# Patient Record
Sex: Female | Born: 1975 | Race: White | Hispanic: No | Marital: Single | Smoking: Current every day smoker
Health system: Southern US, Community
[De-identification: ages and names within clinical notes are randomized; demographics above are authoritative.]

## PROBLEM LIST (undated history)

## (undated) DIAGNOSIS — E119 Type 2 diabetes mellitus without complications: Secondary | ICD-10-CM

## (undated) DIAGNOSIS — F329 Major depressive disorder, single episode, unspecified: Secondary | ICD-10-CM

## (undated) DIAGNOSIS — E785 Hyperlipidemia, unspecified: Secondary | ICD-10-CM

## (undated) DIAGNOSIS — F32A Depression, unspecified: Secondary | ICD-10-CM

## (undated) DIAGNOSIS — K219 Gastro-esophageal reflux disease without esophagitis: Secondary | ICD-10-CM

## (undated) DIAGNOSIS — J45909 Unspecified asthma, uncomplicated: Secondary | ICD-10-CM

---

## 2012-05-09 ENCOUNTER — Emergency Department: Payer: Self-pay | Admitting: Internal Medicine

## 2012-05-09 LAB — URINALYSIS, COMPLETE
Glucose,UR: NEGATIVE mg/dL (ref 0–75)
Leukocyte Esterase: NEGATIVE
Nitrite: NEGATIVE
Squamous Epithelial: 3

## 2012-05-09 LAB — CBC
HCT: 37.6 % (ref 35.0–47.0)
MCH: 29.8 pg (ref 26.0–34.0)
MCHC: 34.2 g/dL (ref 32.0–36.0)
MCV: 87 fL (ref 80–100)
RDW: 13.2 % (ref 11.5–14.5)
WBC: 21.5 10*3/uL — ABNORMAL HIGH (ref 3.6–11.0)

## 2012-05-09 LAB — COMPREHENSIVE METABOLIC PANEL
Anion Gap: 6 — ABNORMAL LOW (ref 7–16)
BUN: 16 mg/dL (ref 7–18)
Bilirubin,Total: 0.8 mg/dL (ref 0.2–1.0)
Calcium, Total: 8.3 mg/dL — ABNORMAL LOW (ref 8.5–10.1)
Chloride: 98 mmol/L (ref 98–107)
Co2: 29 mmol/L (ref 21–32)
Creatinine: 0.85 mg/dL (ref 0.60–1.30)
EGFR (Non-African Amer.): >60
Glucose: 96 mg/dL (ref 65–99)
Osmolality: 267 (ref 275–301)
SGOT(AST): 15 U/L (ref 15–37)
Total Protein: 8.3 g/dL — ABNORMAL HIGH (ref 6.4–8.2)

## 2012-05-09 LAB — HCG, QUANTITATIVE, PREGNANCY: Beta Hcg, Quant.: <1 m[IU]/mL — ABNORMAL LOW

## 2012-05-15 LAB — CULTURE, BLOOD (SINGLE)

## 2013-02-01 ENCOUNTER — Emergency Department: Payer: Self-pay | Admitting: Emergency Medicine

## 2013-03-15 ENCOUNTER — Emergency Department: Payer: Self-pay | Admitting: Emergency Medicine

## 2013-06-05 ENCOUNTER — Emergency Department: Payer: Self-pay | Admitting: Emergency Medicine

## 2013-06-05 LAB — URINALYSIS, COMPLETE
Bacteria: NONE SEEN
Bilirubin,UR: NEGATIVE
Glucose,UR: NEGATIVE mg/dL (ref 0–75)
Hyaline Cast: 2
KETONE: NEGATIVE
Leukocyte Esterase: NEGATIVE
NITRITE: NEGATIVE
Ph: 5 (ref 4.5–8.0)
Protein: NEGATIVE
Specific Gravity: 1.019 (ref 1.003–1.030)
Squamous Epithelial: <1
WBC UR: <1 /HPF (ref 0–5)

## 2013-06-05 LAB — CBC WITH DIFFERENTIAL/PLATELET
Basophil #: 0.1 10*3/uL (ref 0.0–0.1)
Basophil %: 0.7 %
EOS ABS: 0.2 10*3/uL (ref 0.0–0.7)
Eosinophil %: 1.7 %
HCT: 42.2 % (ref 35.0–47.0)
HGB: 14.3 g/dL (ref 12.0–16.0)
Lymphocyte #: 2.3 10*3/uL (ref 1.0–3.6)
Lymphocyte %: 21.4 %
MCH: 29.7 pg (ref 26.0–34.0)
MCHC: 33.9 g/dL (ref 32.0–36.0)
MCV: 88 fL (ref 80–100)
MONO ABS: 0.6 x10 3/mm (ref 0.2–0.9)
MONOS PCT: 5.5 %
NEUTROS PCT: 70.7 %
Neutrophil #: 7.5 10*3/uL — ABNORMAL HIGH (ref 1.4–6.5)
PLATELETS: 335 10*3/uL (ref 150–440)
RBC: 4.82 10*6/uL (ref 3.80–5.20)
RDW: 12.6 % (ref 11.5–14.5)
WBC: 10.7 10*3/uL (ref 3.6–11.0)

## 2013-06-05 LAB — COMPREHENSIVE METABOLIC PANEL
ALBUMIN: 3.6 g/dL (ref 3.4–5.0)
ALK PHOS: 88 U/L
ANION GAP: 7 (ref 7–16)
BUN: 12 mg/dL (ref 7–18)
Bilirubin,Total: 0.3 mg/dL (ref 0.2–1.0)
CALCIUM: 8.9 mg/dL (ref 8.5–10.1)
CHLORIDE: 110 mmol/L — AB (ref 98–107)
CREATININE: 0.8 mg/dL (ref 0.60–1.30)
Co2: 23 mmol/L (ref 21–32)
EGFR (African American): >60
Glucose: 95 mg/dL (ref 65–99)
Osmolality: 279 (ref 275–301)
Potassium: 3.9 mmol/L (ref 3.5–5.1)
SGOT(AST): 22 U/L (ref 15–37)
SGPT (ALT): 19 U/L (ref 12–78)
SODIUM: 140 mmol/L (ref 136–145)
TOTAL PROTEIN: 7.8 g/dL (ref 6.4–8.2)

## 2013-06-05 LAB — LIPASE, BLOOD: Lipase: 153 U/L (ref 73–393)

## 2013-06-05 LAB — PREGNANCY, URINE: Pregnancy Test, Urine: NEGATIVE m[IU]/mL

## 2013-06-05 LAB — TROPONIN I

## 2013-10-11 ENCOUNTER — Emergency Department: Payer: Self-pay | Admitting: Student

## 2014-03-17 ENCOUNTER — Emergency Department: Payer: Self-pay | Admitting: Student

## 2014-12-20 ENCOUNTER — Emergency Department
Admission: EM | Admit: 2014-12-20 | Discharge: 2014-12-20 | Disposition: A | Discharge status: Home / Self Care | Payer: Self-pay | Attending: Student | Admitting: Student

## 2014-12-20 ENCOUNTER — Encounter: Payer: Self-pay | Admitting: Emergency Medicine

## 2014-12-20 DIAGNOSIS — H6501 Acute serous otitis media, right ear: Secondary | ICD-10-CM | POA: Insufficient documentation

## 2014-12-20 DIAGNOSIS — E119 Type 2 diabetes mellitus without complications: Secondary | ICD-10-CM | POA: Insufficient documentation

## 2014-12-20 DIAGNOSIS — F329 Major depressive disorder, single episode, unspecified: Secondary | ICD-10-CM | POA: Insufficient documentation

## 2014-12-20 DIAGNOSIS — F17200 Nicotine dependence, unspecified, uncomplicated: Secondary | ICD-10-CM | POA: Insufficient documentation

## 2014-12-20 DIAGNOSIS — E785 Hyperlipidemia, unspecified: Secondary | ICD-10-CM | POA: Insufficient documentation

## 2014-12-20 DIAGNOSIS — R590 Localized enlarged lymph nodes: Secondary | ICD-10-CM | POA: Insufficient documentation

## 2014-12-20 HISTORY — DX: Depression, unspecified: F32.A

## 2014-12-20 HISTORY — DX: Unspecified asthma, uncomplicated: J45.909

## 2014-12-20 HISTORY — DX: Hyperlipidemia, unspecified: E78.5

## 2014-12-20 HISTORY — DX: Major depressive disorder, single episode, unspecified: F32.9

## 2014-12-20 HISTORY — DX: Type 2 diabetes mellitus without complications: E11.9

## 2014-12-20 HISTORY — DX: Gastro-esophageal reflux disease without esophagitis: K21.9

## 2014-12-20 MED ORDER — TRAMADOL HCL 50 MG PO TABS
50.0000 mg | ORAL_TABLET | Freq: Once | ORAL | Status: AC
  Administered 2014-12-20: 50 mg via ORAL
  Filled 2014-12-20: qty 1

## 2014-12-20 MED ORDER — ONDANSETRON 4 MG PO TBDP
4.0000 mg | ORAL_TABLET | Freq: Once | ORAL | Status: AC
  Administered 2014-12-20: 4 mg via ORAL

## 2014-12-20 MED ORDER — TRAMADOL HCL 50 MG PO TABS: 50.0000 mg | ORAL_TABLET | Freq: Four times a day (QID) | ORAL | Status: DC | PRN

## 2014-12-20 MED ORDER — IBUPROFEN 800 MG PO TABS
800.0000 mg | ORAL_TABLET | Freq: Once | ORAL | Status: AC
  Administered 2014-12-20: 800 mg via ORAL
  Filled 2014-12-20: qty 1

## 2014-12-20 MED ORDER — AMOXICILLIN 500 MG PO CAPS: 500.0000 mg | ORAL_CAPSULE | Freq: Three times a day (TID) | ORAL | Status: DC

## 2014-12-20 MED ORDER — ONDANSETRON 4 MG PO TBDP
ORAL_TABLET | ORAL | Status: AC
  Filled 2014-12-20: qty 1

## 2014-12-20 MED ORDER — IBUPROFEN 800 MG PO TABS: 800.0000 mg | ORAL_TABLET | Freq: Three times a day (TID) | ORAL | Status: DC | PRN

## 2014-12-20 NOTE — ED Notes (Signed)
Pt to ed with c/o left earache x 1 week.  Pt states pain radiates into left side of neck.

## 2014-12-20 NOTE — Discharge Instructions (Signed)

## 2014-12-20 NOTE — ED Notes (Signed)
NAD noted at time of D/C. Pt denies questions or concerns. Pt ambulatory to the lobby at this time. Pt refused wheelchair to the lobby.  

## 2014-12-20 NOTE — ED Provider Notes (Signed)
[facility] Emergency Department Provider Note  ____________________________________________  Time seen: Approximately 7:14 AM  I have reviewed the triage vital signs and the nursing notes.   HISTORY  Chief Complaint Otalgia    HPI Paige Sullivan is a 39 y.o. female patient complaining of left ear pain for 1 week. Patient states she also has a swollen knot left side of her neck. Patient denies any hearing loss. Patient denies any vertigo. Patient stated there is no URI signs symptoms. Patient is rating the pain is over 10. No palliative measures taken for this complaint.   Past Medical History  Diagnosis Date  . GERD (gastroesophageal reflux disease)   . Diabetes mellitus without complication (HCC)   . Hyperlipidemia   . Depression   . Asthma     There are no active problems to display for this patient.   History reviewed. No pertinent past surgical history.  Current Outpatient Rx  Name  Route  Sig  Dispense  Refill  . amoxicillin (AMOXIL) 500 MG capsule   Oral   Take 1 capsule (500 mg total) by mouth 3 (three) times daily.   30 capsule   0   . ibuprofen (ADVIL,MOTRIN) 800 MG tablet   Oral   Take 1 tablet (800 mg total) by mouth every 8 (eight) hours as needed for moderate pain.   15 tablet   0   . traMADol (ULTRAM) 50 MG tablet   Oral   Take 1 tablet (50 mg total) by mouth every 6 (six) hours as needed for moderate pain.   12 tablet   0     Allergies Review of patient's allergies indicates no known allergies.  History reviewed. No pertinent family history.  Social History Social History  Substance Use Topics  . Smoking status: Current Every Day Smoker  . Smokeless tobacco: None  . Alcohol Use: No    Review of Systems Constitutional: No fever/chills Eyes: No visual changes. ENT: No sore throat. Left ear pain Cardiovascular: Denies chest pain. Respiratory: Denies shortness of breath. Gastrointestinal: No abdominal  pain.  No nausea, no vomiting.  No diarrhea.  No constipation. Genitourinary: Negative for dysuria. Musculoskeletal: Negative for back pain. Skin: Negative for rash. Neurological: Negative for headaches, focal weakness or numbness. Psychiatric:Depression Endocrine:Diabetes and hyperlipidemia. Hematological/Lymphatic: 10-point ROS otherwise negative.  ____________________________________________   PHYSICAL EXAM:  VITAL SIGNS: ED Triage Vitals  Enc Vitals Group     BP 12/20/14 0705 112/61 mmHg     Pulse Rate 12/20/14 0705 84     Resp 12/20/14 0705 18     Temp 12/20/14 0705 97.7 F (36.5 C)     Temp Source 12/20/14 0705 Oral     SpO2 12/20/14 0705 97 %     Weight 12/20/14 0705 205 lb (92.987 kg)     Height 12/20/14 0705  (1.626 m)     Head Cir --      Peak Flow --      Pain Score 12/20/14 0706 5     Pain Loc --      Pain Edu? --      Excl. in GC? --     Constitutional: Alert and oriented. Well appearing and in no acute distress. Eyes: Conjunctivae are normal. PERRL. EOMI. Head: Atraumatic. Nose: No congestion/rhinnorhea. Mouth/Throat: Mucous membranes are moist.  Oropharynx non-erythematous. EARS: Edematous and erythematous left TM. Neck: No stridor.  Nocervical spine tenderness to palpation. Hematological/Lymphatic/[facility]. Cardiovascular: Normal rate, regular rhythm. Grossly  normal heart sounds.  Good peripheral circulation. Respiratory: Normal respiratory effort.  No retractions. Lungs CTAB. Gastrointestinal: Soft and nontender. No distention. No abdominal bruits. No CVA tenderness. Musculoskeletal: No lower extremity tenderness nor edema.  No joint effusions. Neurologic:  Normal speech and language. No gross focal neurologic deficits are appreciated. No gait instability. Skin:  Skin is warm, dry and intact. No rash noted. Psychiatric: Mood and affect are normal. Speech and behavior are  normal.  ____________________________________________   LABS (all labs ordered are listed, but only abnormal results are displayed)  Labs Reviewed - No data to display ____________________________________________  EKG   ____________________________________________  RADIOLOGY   ____________________________________________   PROCEDURES  Procedure(s) performed: None  Critical Care performed: No  ____________________________________________   INITIAL IMPRESSION / ASSESSMENT AND PLAN / ED COURSE  Pertinent labs & imaging results that were available during my care of the patient were reviewed by me and considered in my medical decision making (see chart for details).  Left otitis media. Patient given discharge Instructions. Patient advised to follow-up in ENT clinic if her condition does not improve within the next 3 days. Patient given a prescription for amoxicillin, tramadol, and ibuprofen. ____________________________________________   FINAL CLINICAL IMPRESSION(S) / ED DIAGNOSES  Final diagnoses:  Right acute serous otitis media, recurrence not specified      Joni Reiningonald K Serenity Batley, PA-C 12/20/14 0725  Gayla DossEryka A Gayle, MD 12/20/14 1540

## 2015-01-27 ENCOUNTER — Encounter: Payer: Self-pay | Admitting: Emergency Medicine

## 2015-01-27 ENCOUNTER — Emergency Department
Admission: EM | Admit: 2015-01-27 | Discharge: 2015-01-27 | Disposition: A | Discharge status: Home / Self Care | Payer: Self-pay | Attending: Emergency Medicine | Admitting: Emergency Medicine

## 2015-01-27 ENCOUNTER — Emergency Department: Payer: Self-pay

## 2015-01-27 DIAGNOSIS — Z3202 Encounter for pregnancy test, result negative: Secondary | ICD-10-CM | POA: Insufficient documentation

## 2015-01-27 DIAGNOSIS — F17200 Nicotine dependence, unspecified, uncomplicated: Secondary | ICD-10-CM | POA: Insufficient documentation

## 2015-01-27 DIAGNOSIS — E119 Type 2 diabetes mellitus without complications: Secondary | ICD-10-CM | POA: Insufficient documentation

## 2015-01-27 DIAGNOSIS — K529 Noninfective gastroenteritis and colitis, unspecified: Secondary | ICD-10-CM | POA: Insufficient documentation

## 2015-01-27 LAB — CBC WITH DIFFERENTIAL/PLATELET
BASOS PCT: 1 %
Basophils Absolute: 0.1 10*3/uL (ref 0–0.1)
EOS ABS: 0.1 10*3/uL (ref 0–0.7)
Eosinophils Relative: 1 %
HCT: 44.7 % (ref 35.0–47.0)
HEMOGLOBIN: 14.9 g/dL (ref 12.0–16.0)
LYMPHS ABS: 0.7 10*3/uL — AB (ref 1.0–3.6)
Lymphocytes Relative: 5 %
MCH: 28.4 pg (ref 26.0–34.0)
MCHC: 33.3 g/dL (ref 32.0–36.0)
MCV: 85.1 fL (ref 80.0–100.0)
MONO ABS: 0.3 10*3/uL (ref 0.2–0.9)
MONOS PCT: 2 %
NEUTROS PCT: 91 %
Neutro Abs: 13.1 10*3/uL — ABNORMAL HIGH (ref 1.4–6.5)
Platelets: 315 10*3/uL (ref 150–440)
RBC: 5.25 MIL/uL — ABNORMAL HIGH (ref 3.80–5.20)
RDW: 13.8 % (ref 11.5–14.5)
WBC: 14.2 10*3/uL — ABNORMAL HIGH (ref 3.6–11.0)

## 2015-01-27 LAB — URINALYSIS COMPLETE WITH MICROSCOPIC (ARMC ONLY)
BACTERIA UA: NONE SEEN
Bilirubin Urine: NEGATIVE
Glucose, UA: NEGATIVE mg/dL
Ketones, ur: NEGATIVE mg/dL
LEUKOCYTES UA: NEGATIVE
NITRITE: NEGATIVE
PH: 6 (ref 5.0–8.0)
Protein, ur: 30 mg/dL — AB
SPECIFIC GRAVITY, URINE: 1.019 (ref 1.005–1.030)

## 2015-01-27 LAB — COMPREHENSIVE METABOLIC PANEL
ALT: 20 U/L (ref 14–54)
ANION GAP: 9 (ref 5–15)
AST: 15 U/L (ref 15–41)
Albumin: 4.1 g/dL (ref 3.5–5.0)
Alkaline Phosphatase: 89 U/L (ref 38–126)
BILIRUBIN TOTAL: 0.6 mg/dL (ref 0.3–1.2)
BUN: 16 mg/dL (ref 6–20)
CALCIUM: 8.9 mg/dL (ref 8.9–10.3)
CO2: 24 mmol/L (ref 22–32)
Chloride: 103 mmol/L (ref 101–111)
Creatinine, Ser: 0.5 mg/dL (ref 0.44–1.00)
GFR calc non Af Amer: >60 mL/min (ref >60)
Glucose, Bld: 113 mg/dL — ABNORMAL HIGH (ref 65–99)
POTASSIUM: 3.8 mmol/L (ref 3.5–5.1)
SODIUM: 136 mmol/L (ref 135–145)
TOTAL PROTEIN: 7.6 g/dL (ref 6.5–8.1)

## 2015-01-27 LAB — POCT PREGNANCY, URINE: PREG TEST UR: NEGATIVE

## 2015-01-27 LAB — LIPASE, BLOOD: Lipase: 25 U/L (ref 11–51)

## 2015-01-27 MED ORDER — MORPHINE SULFATE (PF) 4 MG/ML IV SOLN
4.0000 mg | Freq: Once | INTRAVENOUS | Status: AC
  Administered 2015-01-27: 4 mg via INTRAVENOUS
  Filled 2015-01-27: qty 1

## 2015-01-27 MED ORDER — SODIUM CHLORIDE 0.9 % IV SOLN
1000.0000 mL | Freq: Once | INTRAVENOUS | Status: AC
  Administered 2015-01-27: 1000 mL via INTRAVENOUS

## 2015-01-27 MED ORDER — DICYCLOMINE HCL 20 MG PO TABS: 20.0000 mg | ORAL_TABLET | Freq: Three times a day (TID) | ORAL | Status: AC | PRN

## 2015-01-27 MED ORDER — ONDANSETRON HCL 4 MG PO TABS: 4.0000 mg | ORAL_TABLET | Freq: Every day | ORAL | Status: AC | PRN

## 2015-01-27 MED ORDER — ONDANSETRON HCL 4 MG/2ML IJ SOLN: 4.0000 mg | Freq: Once | INTRAMUSCULAR | Status: DC

## 2015-01-27 MED ORDER — ONDANSETRON HCL 4 MG/2ML IJ SOLN
4.0000 mg | Freq: Once | INTRAMUSCULAR | Status: AC
  Administered 2015-01-27: 4 mg via INTRAVENOUS
  Filled 2015-01-27: qty 2

## 2015-01-27 NOTE — ED Provider Notes (Signed)
[facility] Emergency Department Provider Note     Time seen: ----------------------------------------- 8:06 AM on 01/27/2015 -----------------------------------------    I have reviewed the triage vital signs and the nursing notes.   HISTORY  Chief Complaint Emesis    HPI Paige Sullivan is a 40 y.o. female who presents ER for nausea vomiting and diarrhea. Patient also has some mid abdominal pain that started last night. Patient states she vomited about 7 times and had diarrhea more times that. Patient denies any fevers but has had chills, denies chest pain or shortness of breath. Nothing makes her symptoms better or worse.   Past Medical History  Diagnosis Date  . GERD (gastroesophageal reflux disease)   . Diabetes mellitus without complication (HCC)   . Hyperlipidemia   . Depression   . Asthma     There are no active problems to display for this patient.   History reviewed. No pertinent past surgical history.  Allergies Tramadol  Social History Social History  Substance Use Topics  . Smoking status: Current Every Day Smoker  . Smokeless tobacco: None  . Alcohol Use: No    Review of Systems Constitutional: Negative for fever. Eyes: Negative for visual changes. ENT: Negative for sore throat. Cardiovascular: Negative for chest pain. Respiratory: Negative for shortness of breath. Gastrointestinal: Positive for abdominal pain, vomiting and diarrhea Genitourinary: Negative for dysuria. Musculoskeletal: Negative for back pain. Skin: Negative for rash. Neurological: Negative for headaches, focal weakness or numbness.  10-point ROS otherwise negative.  ____________________________________________   PHYSICAL EXAM:  VITAL SIGNS: ED Triage Vitals  Enc Vitals Group     BP 01/27/15 0756 125/68 mmHg     Pulse Rate 01/27/15 0756 112     Resp 01/27/15 0756 18     Temp 01/27/15 0756 98.5 F (36.9 C)     Temp Source 01/27/15  0756 Oral     SpO2 01/27/15 0756 97 %     Weight 01/27/15 0756 191 lb (86.637 kg)     Height 01/27/15 0756  (1.626 m)     Head Cir --      Peak Flow --      Pain Score 01/27/15 0757 5     Pain Loc --      Pain Edu? --      Excl. in GC? --     Constitutional: Alert and oriented. Well appearing and in no distress. Eyes: Conjunctivae are normal. PERRL. Normal extraocular movements. ENT   Head: Normocephalic and atraumatic.   Nose: No congestion/rhinnorhea.   Mouth/Throat: Mucous membranes are moist.   Neck: No stridor. Cardiovascular: Normal rate, regular rhythm. Normal and symmetric distal pulses are present in all extremities. No murmurs, rubs, or gallops. Respiratory: Normal respiratory effort without tachypnea nor retractions. Breath sounds are clear and equal bilaterally. No wheezes/rales/rhonchi. Gastrointestinal: Nonfocal abdominal tenderness, no rebound or guarding. Normal bowel sounds. Musculoskeletal: Nontender with normal range of motion in all extremities. No joint effusions.  No lower extremity tenderness nor edema. Neurologic:  Normal speech and language. No gross focal neurologic deficits are appreciated. Speech is normal. No gait instability. Skin:  Skin is warm, dry and intact. No rash noted. Psychiatric: Mood and affect are normal. Speech and behavior are normal. Patient exhibits appropriate insight and judgment. ____________________________________________  ED COURSE:  Pertinent labs & imaging results that were available during my care of the patient were reviewed by me and considered in my medical decision making (see chart for details). Patient is in  no acute distress, likely Norovirus infection. Will receive IV fluids and antiemetics. ____________________________________________    LABS (pertinent positives/negatives)  Labs Reviewed  CBC WITH DIFFERENTIAL/PLATELET - Abnormal; Notable for the following:    WBC 14.2 (*)    RBC 5.25 (*)     Neutro Abs 13.1 (*)    Lymphs Abs 0.7 (*)    All other components within normal limits  COMPREHENSIVE METABOLIC PANEL - Abnormal; Notable for the following:    Glucose, Bld 113 (*)    All other components within normal limits  URINALYSIS COMPLETEWITH MICROSCOPIC ([facility] ONLY) - Abnormal; Notable for the following:    Color, Urine YELLOW (*)    APPearance CLEAR (*)    Hgb urine dipstick 1+ (*)    Protein, ur 30 (*)    Squamous Epithelial / LPF 0-5 (*)    All other components within normal limits  LIPASE, BLOOD  POCT PREGNANCY, URINE  POC URINE PREG, ED    RADIOLOGY  Abdomen 2 view IMPRESSION: The bowel gas pattern suggests a gastroenteritis type process. There is no evidence of obstruction or perforation. There are multiple calcified gallstones. ____________________________________________  FINAL ASSESSMENT AND PLAN  Gastroenteritis  Plan: Patient with labs and imaging as dictated above. Patient is in no acute distress, her symptoms are gone now. I will discharge with Zofran and Bentyl. She is stable for outpatient follow-up with her doctor.   Emily Filbert, MD   Emily Filbert, MD 01/27/15 [id number]

## 2015-01-27 NOTE — ED Notes (Signed)
Reports n/v/d, chills and upper abd pain since last pm

## 2015-01-27 NOTE — Discharge Instructions (Signed)
Norovirus Infection °A norovirus infection is caused by exposure to a virus in a group of similar viruses (noroviruses). This type of infection causes inflammation in your stomach and intestines (gastroenteritis). Norovirus is the most common cause of gastroenteritis. It also causes food poisoning. °Anyone can get a norovirus infection. It spreads very easily (contagious). You can get it from contaminated food, water, surfaces, or other people. Norovirus is found in the stool or vomit of infected people. You can spread the infection as soon as you feel sick until 2 weeks after you recover.  °Symptoms usually begin within 2 days after you become infected. Most norovirus symptoms affect the digestive system. °CAUSES °Norovirus infection is caused by contact with norovirus. You can catch norovirus if you: °· Eat or drink something contaminated with norovirus. °· Touch surfaces or objects contaminated with norovirus and then put your hand in your mouth. °· Have direct contact with an infected person who has symptoms. °· Share food, drink, or utensils with someone with who is sick with norovirus. °SIGNS AND SYMPTOMS °Symptoms of norovirus may include: °· Nausea. °· Vomiting. °· Diarrhea. °· Stomach cramps. °· Fever. °· Chills. °· Headache. °· Muscle aches. °· Tiredness. °DIAGNOSIS °Your health care provider may suspect norovirus based on your symptoms and physical exam. Your health care provider may also test a sample of your stool or vomit for the virus.  °TREATMENT °There is no specific treatment for norovirus. Most people get better without treatment in about 2 days. °HOME CARE INSTRUCTIONS °· Replace lost fluids by drinking plenty of water or rehydration fluids containing important minerals called electrolytes. This prevents dehydration. Drink enough fluid to keep your urine clear or pale yellow. °· Do not prepare food for others while you are infected. Wait at least 3 days after recovering from the illness to do  that. °PREVENTION  °· Wash your hands often, especially after using the toilet or changing a diaper. °· Wash fruits and vegetables thoroughly before preparing or serving them. °· Throw out any food that a sick person may have touched. °· Disinfect contaminated surfaces immediately after someone in the household has been sick. Use a bleach-based household cleaner. °· Immediately remove and wash soiled clothes or sheets. °SEEK MEDICAL CARE IF: °· Your vomiting, diarrhea, and stomach pain is getting worse. °· Your symptoms of norovirus do not go away after 2-3 days. °SEEK IMMEDIATE MEDICAL CARE IF:  °You develop symptoms of dehydration that do not improve with fluid replacement. This may include: °· Excessive sleepiness. °· Lack of tears. °· Dry mouth. °· Dizziness when standing. °· Weak pulse. °  °This information is not intended to replace advice given to you by your health care provider. Make sure you discuss any questions you have with your health care provider. °  °Document Released: 03/12/2002 Document Revised: 01/10/2014 Document Reviewed: 05/30/2013 °Elsevier Interactive Patient Education ©2016 [employer] ° °

## 2017-06-26 IMAGING — CR DG ABDOMEN 2V
1 series · 3 of 3 positions shown · non-contrast
Comparison: None in PACs

CLINICAL DATA: Vomiting abdominal pain and diarrhea with onset last
night with worsening symptoms ; history of diabetes,
gastroesophageal reflux, and gallstones.

EXAM:
ABDOMEN - 2 VIEW

[Series 1: w abdomen upright · 0.14mm/px · 3 of 3 slices shown]
[im 1/3]
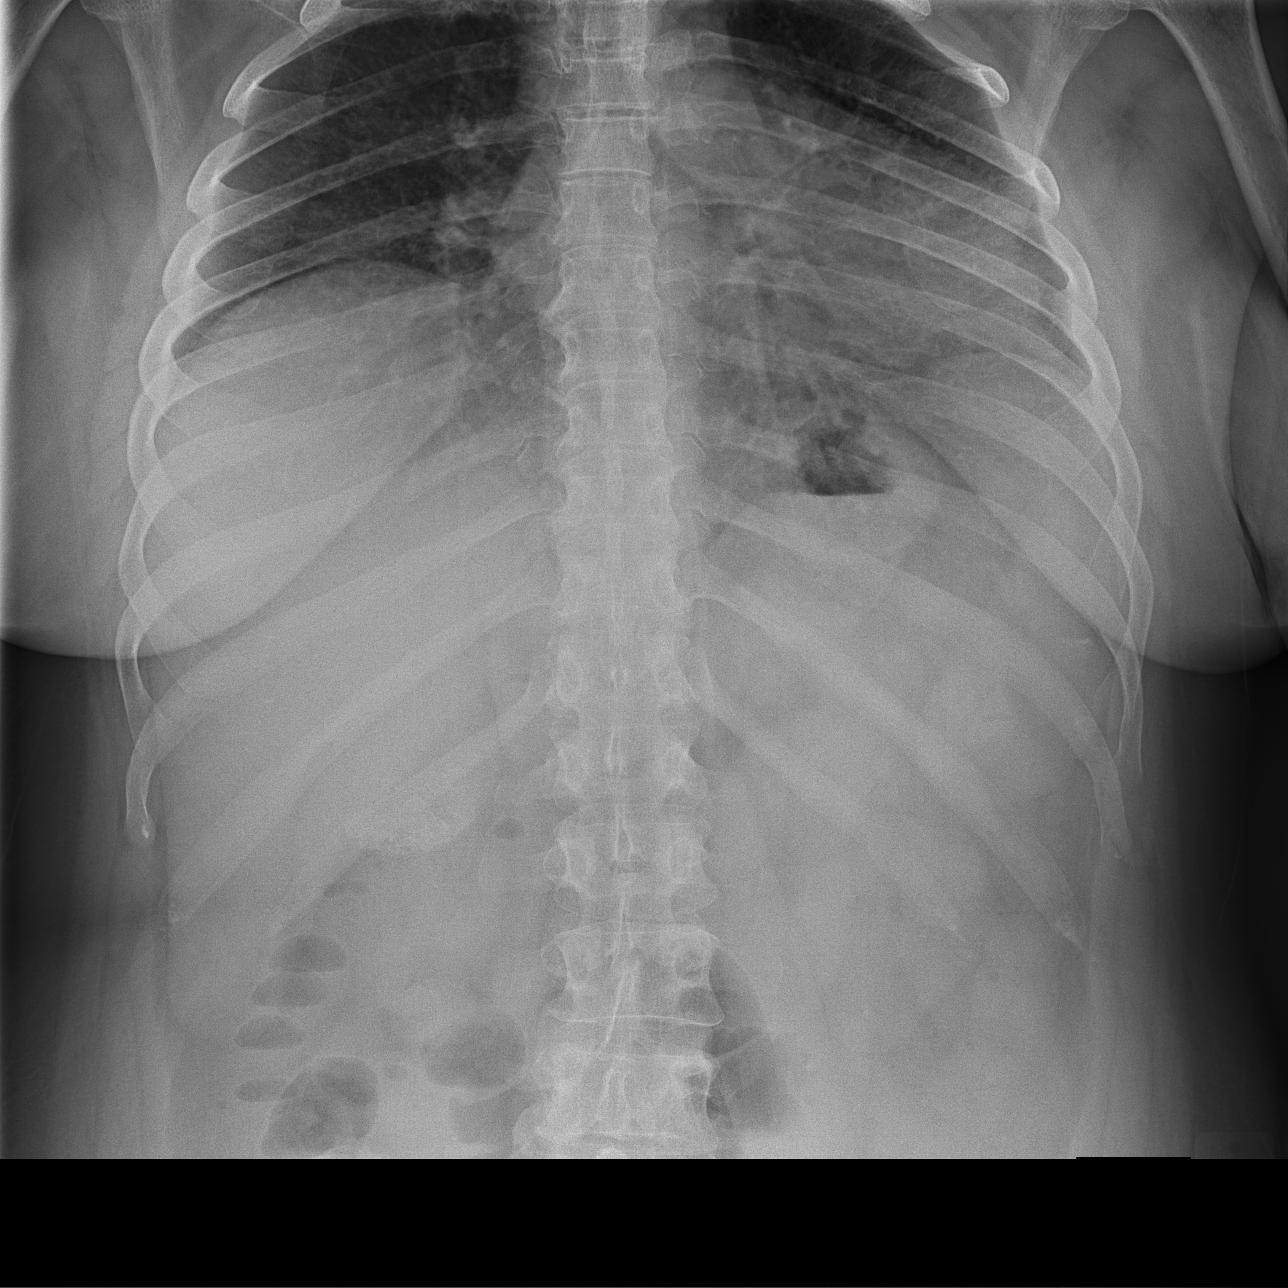
[im 2/3]
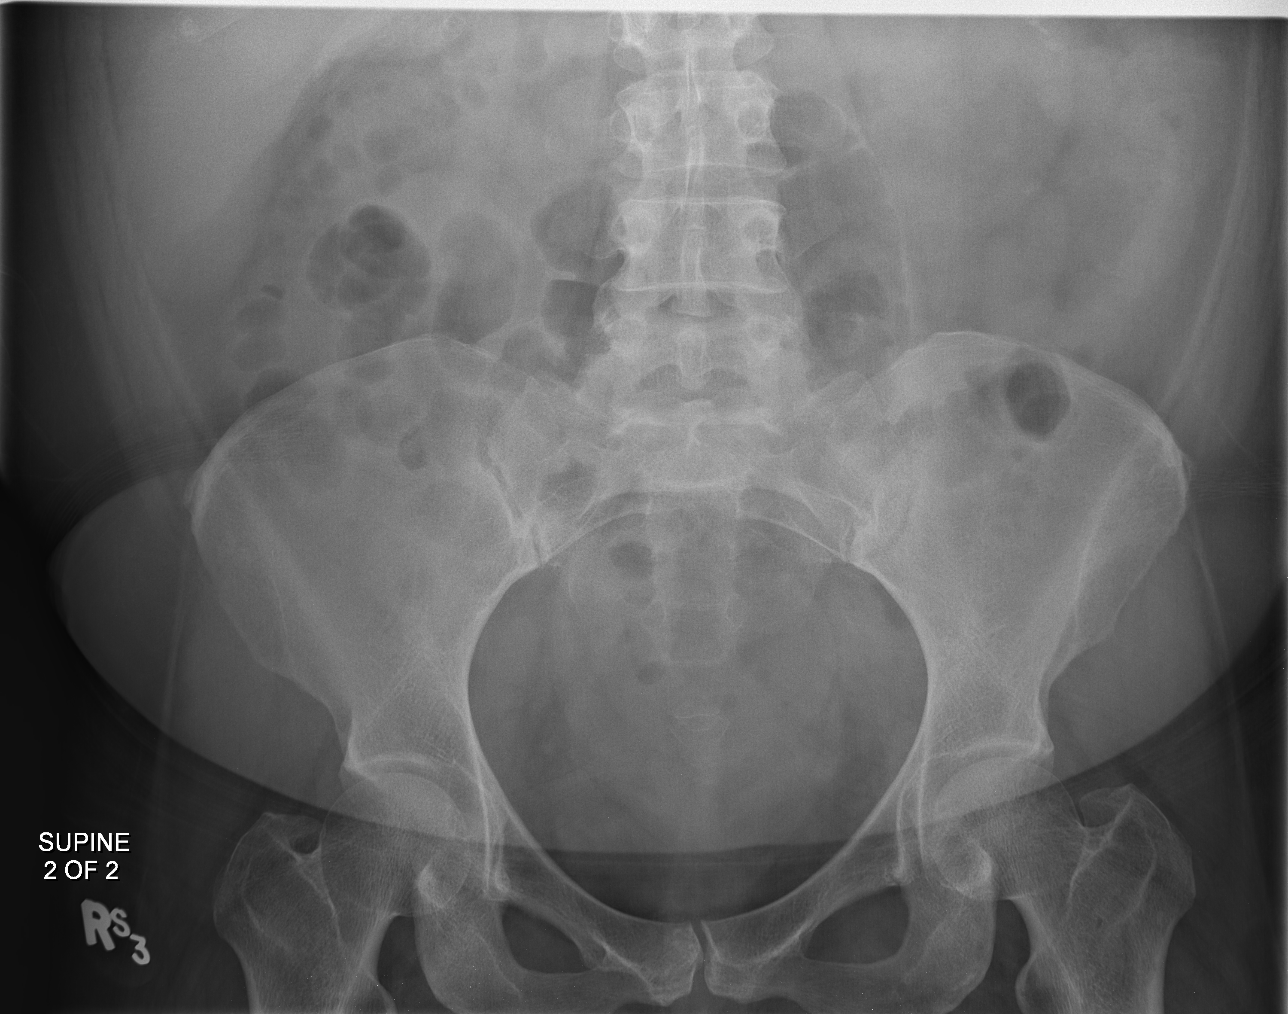
[im 3/3]
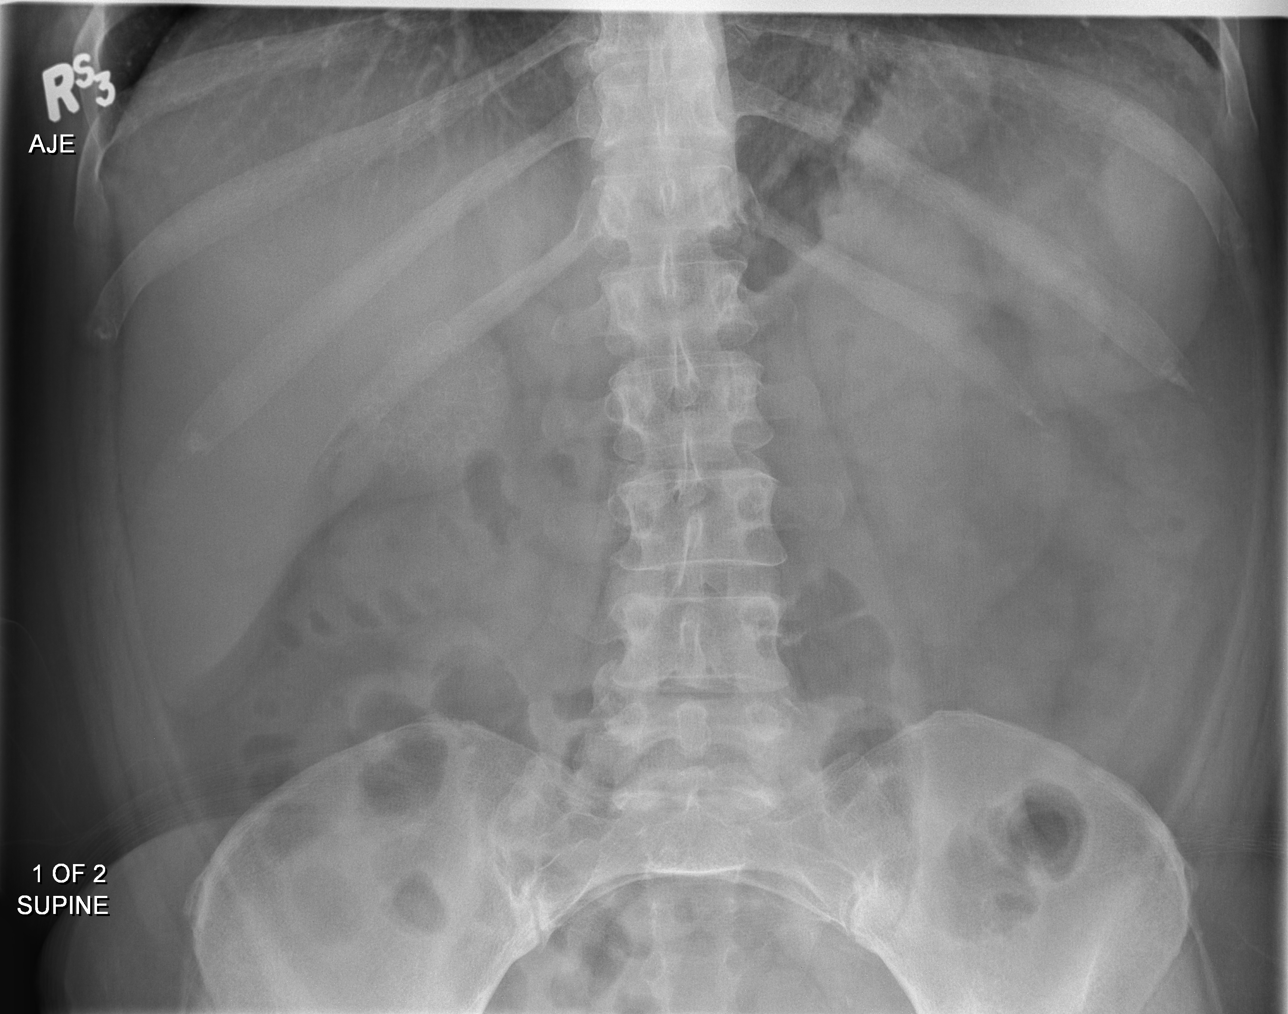

[3 of 3 positions shown; findings below may reference images not displayed]

FINDINGS: There are few small colonic air-fluid levels in the right and mid
abdomen. There is no small or large bowel obstructive pattern. There
is no free extraluminal gas. There is no significant gas or stool in
the rectum. There is a small amount of gas and fluid within the
stomach. There are numerous gallstones layering in the deep and a
portion of the gallbladder. The lung bases exhibit no acute
abnormalities. The bony structures are unremarkable.
IMPRESSION: The bowel gas pattern suggests a gastroenteritis type process. There
is no evidence of obstruction or perforation. There are multiple
calcified gallstones.
